# Patient Record
Sex: Male | Born: 2000 | Race: Black or African American | Hispanic: No | Marital: Single | Smoking: Never smoker
Health system: Southern US, Community
[De-identification: ages and names within clinical notes are randomized; demographics above are authoritative.]

## PROBLEM LIST (undated history)

## (undated) DIAGNOSIS — J45909 Unspecified asthma, uncomplicated: Secondary | ICD-10-CM

---
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
## 2022-01-12 IMAGING — DX DG HAND COMPLETE 3+V*R*
3 series · 3 of 3 positions shown · non-contrast
Comparison: None.

CLINICAL DATA: 19-year-old male with pain after punching an object.

EXAM:
RIGHT HAND - COMPLETE 3+ VIEW

[x hand pa right]
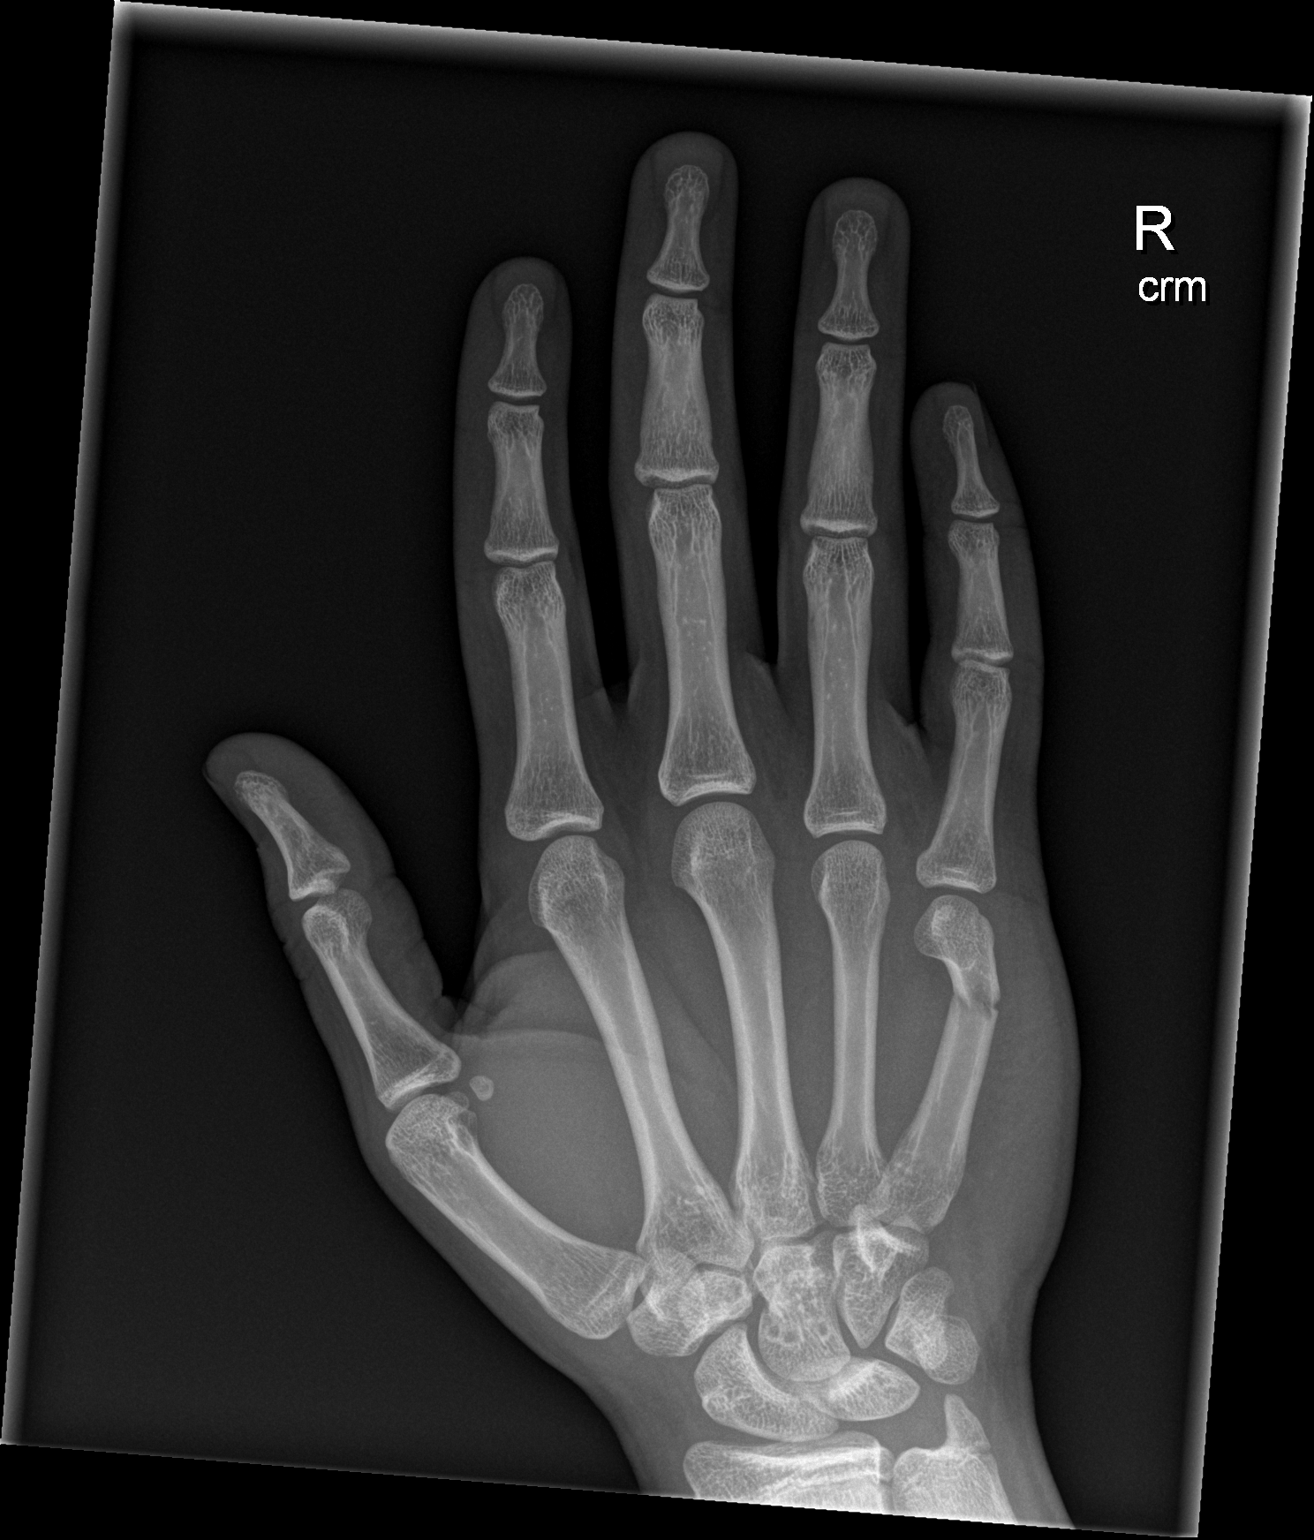

[x hand obl right]
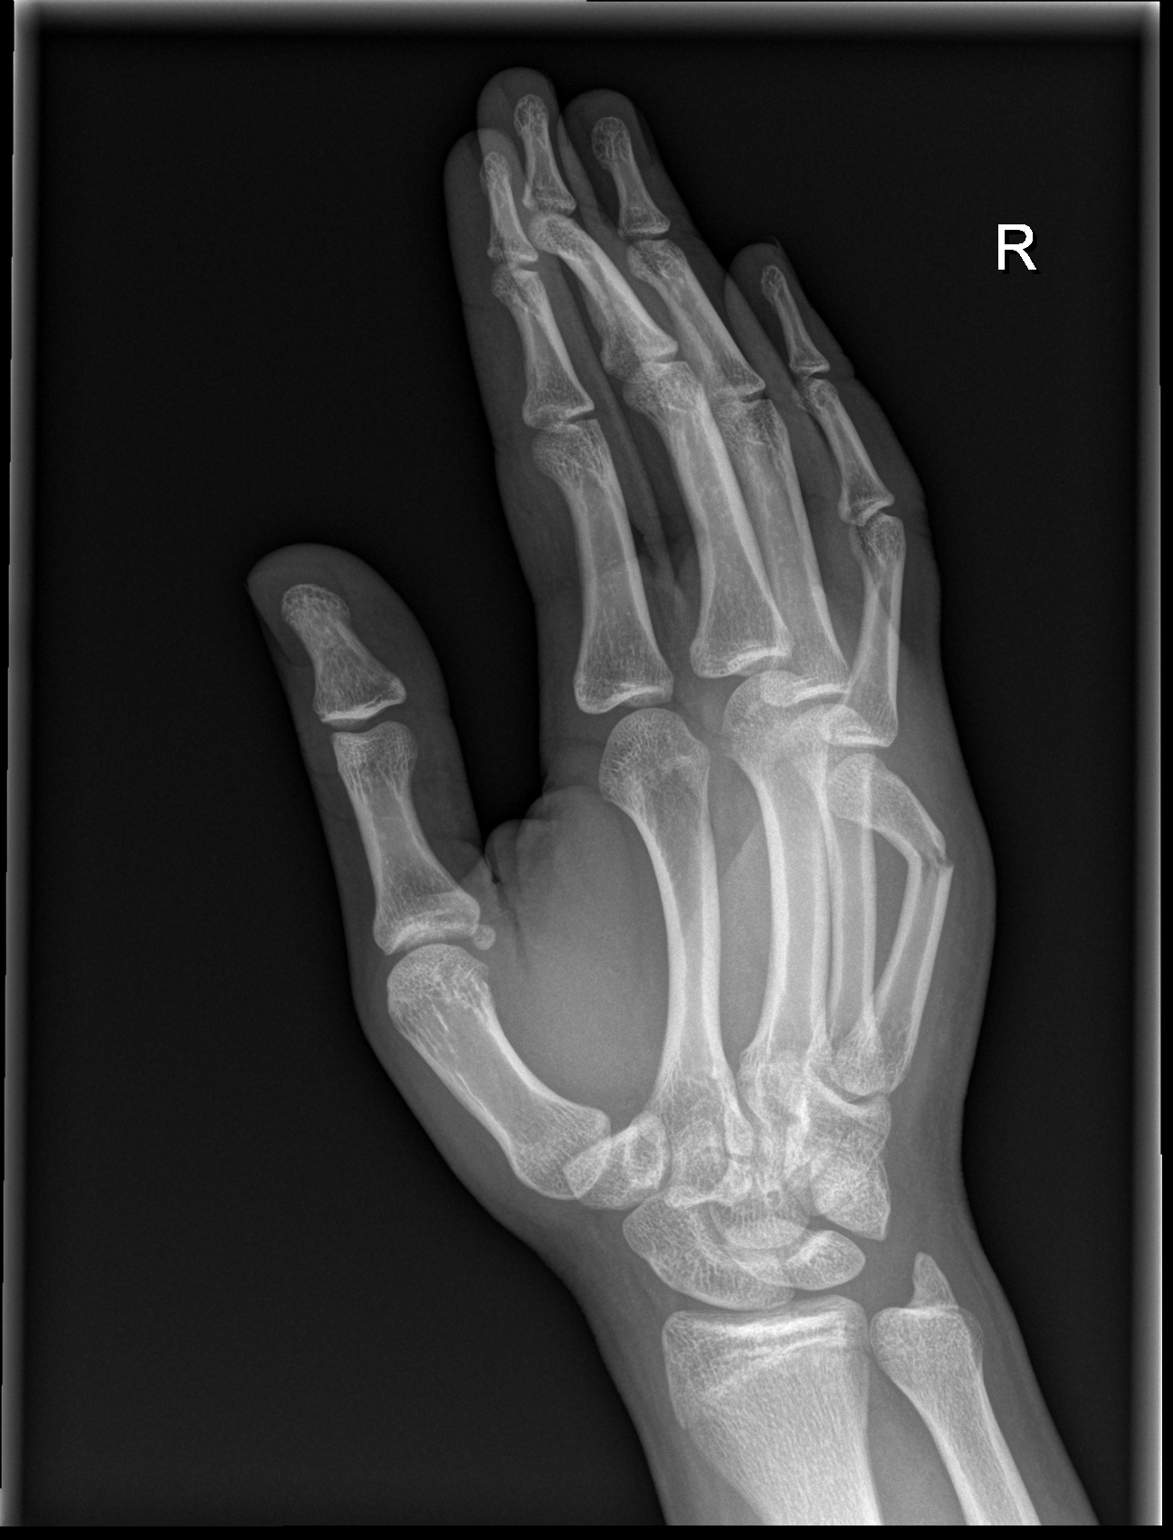

[x hand lat right]
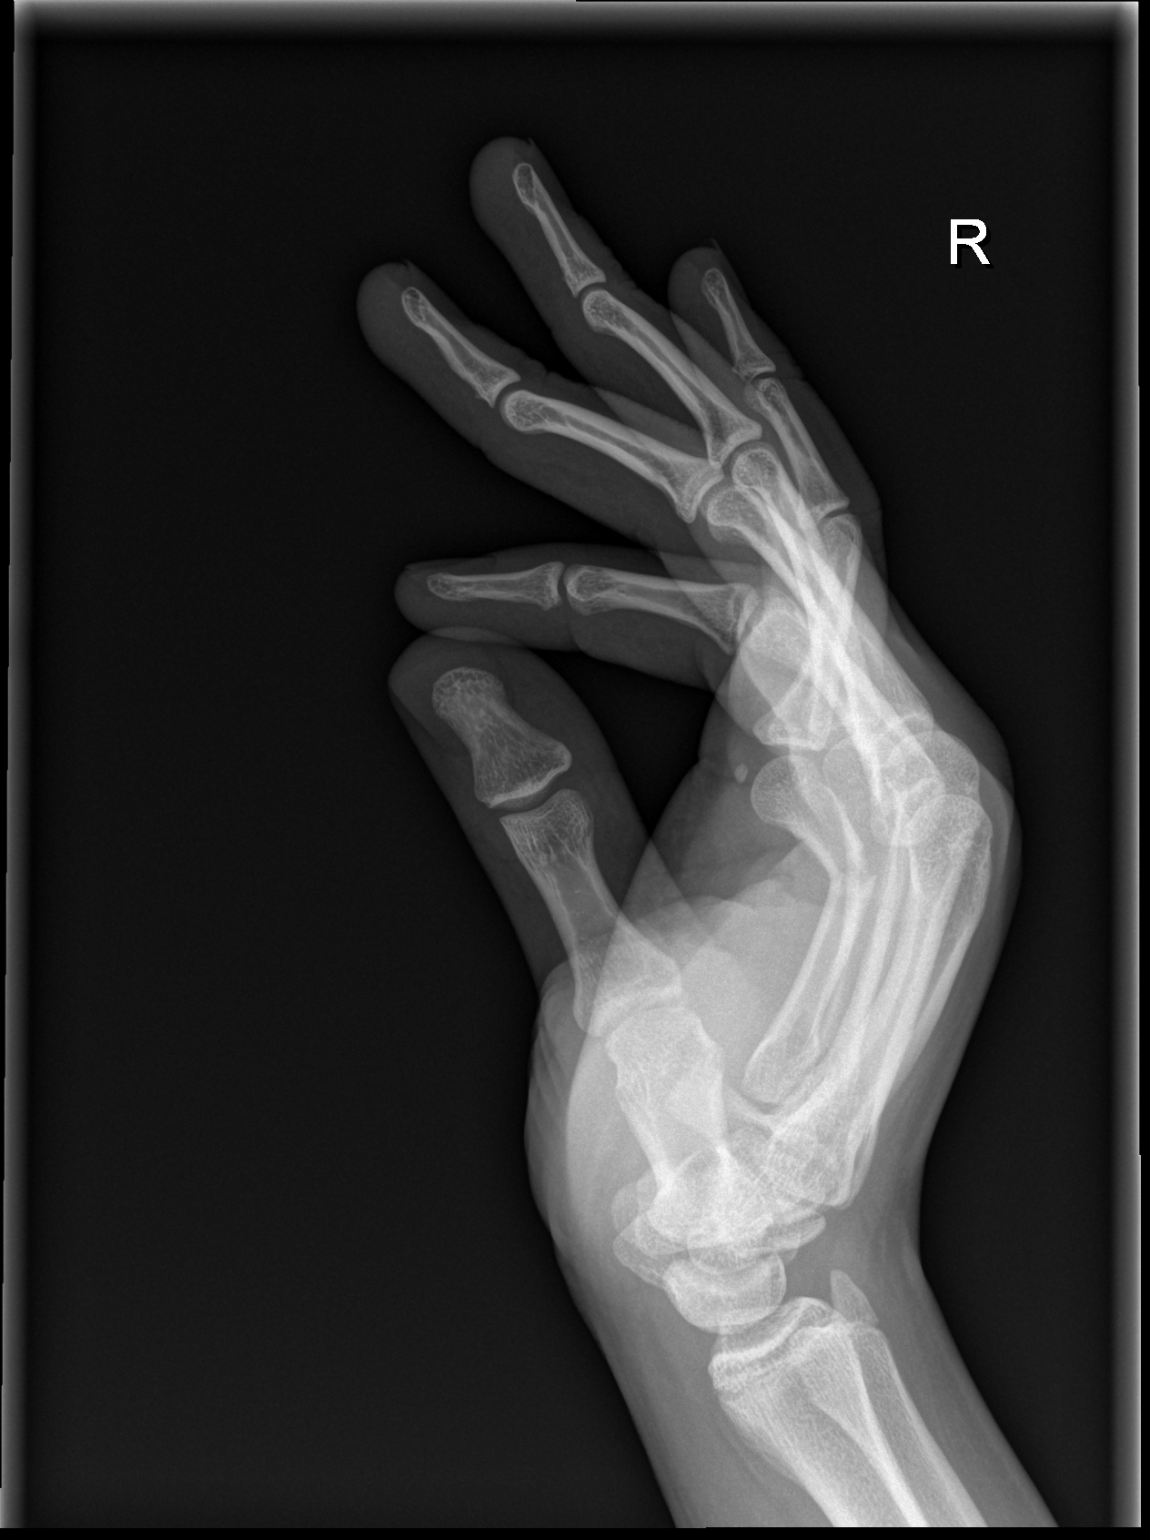

[3 of 3 positions shown; findings below may reference images not displayed]

FINDINGS: Transverse mildly comminuted fracture of the distal shaft right 5th
metacarpal with about 60 degrees of radial and volar angulation.
There is slight ulnar displacement. Regional soft tissue swelling.
Fifth MCP joint is spared. Nearing skeletal maturity. Normal
underlying bone mineralization. No other osseous abnormality
identified.
IMPRESSION: Transverse, angulated fracture of the distal shaft of the right 5th
metacarpal.

## 2022-02-05 ENCOUNTER — Encounter (HOSPITAL_COMMUNITY): Payer: Self-pay | Admitting: *Deleted

## 2022-02-05 ENCOUNTER — Ambulatory Visit (HOSPITAL_COMMUNITY)
Admission: EM | Admit: 2022-02-05 | Discharge: 2022-02-05 | Disposition: A | Discharge status: Home / Self Care | Payer: Medicaid Other

## 2022-02-05 ENCOUNTER — Other Ambulatory Visit: Payer: Self-pay

## 2022-02-05 DIAGNOSIS — J069 Acute upper respiratory infection, unspecified: Secondary | ICD-10-CM | POA: Diagnosis not present

## 2022-02-05 DIAGNOSIS — S46912A Strain of unspecified muscle, fascia and tendon at shoulder and upper arm level, left arm, initial encounter: Secondary | ICD-10-CM | POA: Diagnosis not present

## 2022-02-05 NOTE — ED Provider Notes (Signed)
[facility]    CSN: [id number] Arrival date & time: 02/05/22  0810      History   Chief Complaint Chief Complaint  Patient presents with   Muscle Pain   Cough   Nasal Congestion    HPI Dean Lane is a 21 y.o. male.   He has 2 complaints. Last week on 02/02/22, he noticed his L anterior shoulder/upper chest was sore. It really only bothers him when he tries to lie on his L side and occasionally at work in [employer] where he works when he is lifting a lot of things. Has done nothing to help it.   Also c/o bad taste in his mouth for the last 5 days. ALso has little appetite, congestion, post nasal drainage, cough, and mild sore throat. Denies fever, chills, myalgias. HAs not taken anything for his symptoms. They aren't bothersome enough to keep him from his usual activities.    Muscle Pain  Cough   Past Medical History:  Diagnosis Date   Asthma     Patient Active Problem List   Diagnosis Date Noted   Status asthmaticus 01/16/2012   Acute respiratory failure (HCC) 01/16/2012   Community acquired pneumonia 01/14/2012   Hypoxemia 01/14/2012   Asthma exacerbation 01/14/2012    History reviewed. No pertinent surgical history.     Home Medications    Prior to Admission medications   Medication Sig Start Date End Date Taking? Authorizing Provider  albuterol (PROVENTIL HFA;VENTOLIN HFA) 108 (90 BASE) MCG/ACT inhaler Inhale 2 puffs into the lungs every 4 (four) hours as needed. For wheezing and shortness of breath    [provider]    Family History Family History  Problem Relation Age of Onset   Asthma Sister    Asthma Brother    Arthritis Maternal Grandmother    Hypertension Maternal Grandmother    Birth defects Maternal Grandfather    Hyperlipidemia Paternal Grandmother    Depression Paternal Grandfather    Hyperlipidemia Paternal Grandfather     Social History Social History   Tobacco Use   Smoking status: Never      Allergies   Patient has no known allergies.   Review of Systems Review of Systems  Respiratory:  Positive for cough.      Physical Exam Triage Vital Signs ED Triage Vitals  Enc Vitals Group     BP 02/05/22 0855 112/74     Pulse Rate 02/05/22 0855 69     Resp 02/05/22 0855 18     Temp 02/05/22 0855 98.3 F (36.8 C)     Temp src --      SpO2 02/05/22 0855 98 %     Weight --      Height --      Head Circumference --      Peak Flow --      Pain Score 02/05/22 0853 4     Pain Loc --      Pain Edu? --      Excl. in GC? --    No data found.  Updated Vital Signs BP 112/74   Pulse 69   Temp 98.3 F (36.8 C)   Resp 18   SpO2 98%   Visual Acuity Right Eye Distance:   Left Eye Distance:   Bilateral Distance:    Right Eye Near:   Left Eye Near:    Bilateral Near:     Physical Exam Constitutional:      Appearance: Normal appearance.  HENT:  Right Ear: Tympanic membrane, ear canal and external ear normal.     Left Ear: Tympanic membrane, ear canal and external ear normal.     Nose: Nose normal.     Mouth/Throat:     Mouth: Mucous membranes are moist.     Dentition: Normal dentition. No dental caries or gum lesions.     Pharynx: Oropharynx is clear. No oropharyngeal exudate or posterior oropharyngeal erythema.     Tonsils: No tonsillar exudate. 1+ on the right. 1+ on the left.  Cardiovascular:     Rate and Rhythm: Normal rate and regular rhythm.  Pulmonary:     Effort: Pulmonary effort is normal.     Breath sounds: Normal breath sounds.  Musculoskeletal:     Left shoulder: Normal. No deformity, tenderness or bony tenderness. Normal range of motion. Normal strength.       Arms:  Neurological:     Mental Status: He is alert.      UC Treatments / Results  Labs (all labs ordered are listed, but only abnormal results are displayed) Labs Reviewed - No data to display  EKG   Radiology No results found.  Procedures Procedures (including  critical care time)  Medications Ordered in UC Medications - No data to display  Initial Impression / Assessment and Plan / UC Course  I have reviewed the triage vital signs and the nursing notes.  Pertinent labs & imaging results that were available during my care of the patient were reviewed by me and considered in my medical decision making (see chart for details).     REviewed supportive care measures for both likely URI and for shoulder discomfort.  Final Clinical Impressions(s) / UC Diagnoses   Final diagnoses:  Viral upper respiratory tract infection  Shoulder strain, left, initial encounter     Discharge Instructions      Try Mucinex (or generic guaifenesin) to help with congestion and bad taste in your mouth. Saline nasal spray (it's just salt water sold in a nose spray bottle in pharmacies) several times a day can also help.   You can use ibuprofen for your shoulder pain if needed. Just follow package instructions. Stretching gently can also help.    ED Prescriptions   None    PDMP not reviewed this encounter.   Cathlyn Parsons, NP 02/05/22 1110

## 2022-02-05 NOTE — ED Triage Notes (Signed)
Pt reports Last wed he started to have muscle pain lt shoulder pain. Pt reports he could not sleep on lt side due to pain. Pt also has pain when taking a deep breath. Pt has had in creased mucous production and a change in tasted when drinking liquids. Pt reports sore throat for one day.

## 2022-02-05 NOTE — Discharge Instructions (Addendum)
Try Mucinex (or generic guaifenesin) to help with congestion and bad taste in your mouth. Saline nasal spray (it's just salt water sold in a nose spray bottle in pharmacies) several times a day can also help.   You can use ibuprofen for your shoulder pain if needed. Just follow package instructions. Stretching gently can also help.

## 2022-06-18 ENCOUNTER — Encounter (HOSPITAL_COMMUNITY): Payer: Self-pay

## 2022-06-18 ENCOUNTER — Emergency Department (HOSPITAL_COMMUNITY): Payer: No Typology Code available for payment source

## 2022-06-18 ENCOUNTER — Other Ambulatory Visit: Payer: Self-pay

## 2022-06-18 ENCOUNTER — Emergency Department (HOSPITAL_COMMUNITY)
Admission: EM | Admit: 2022-06-18 | Discharge: 2022-06-18 | Disposition: A | Discharge status: Home / Self Care | Payer: No Typology Code available for payment source | Attending: Emergency Medicine | Admitting: Emergency Medicine

## 2022-06-18 DIAGNOSIS — M25512 Pain in left shoulder: Secondary | ICD-10-CM | POA: Diagnosis not present

## 2022-06-18 DIAGNOSIS — M25561 Pain in right knee: Secondary | ICD-10-CM | POA: Insufficient documentation

## 2022-06-18 DIAGNOSIS — S61411A Laceration without foreign body of right hand, initial encounter: Secondary | ICD-10-CM | POA: Insufficient documentation

## 2022-06-18 DIAGNOSIS — S6991XA Unspecified injury of right wrist, hand and finger(s), initial encounter: Secondary | ICD-10-CM | POA: Diagnosis present

## 2022-06-18 DIAGNOSIS — Y92410 Unspecified street and highway as the place of occurrence of the external cause: Secondary | ICD-10-CM | POA: Insufficient documentation

## 2022-06-18 MED ORDER — IBUPROFEN 400 MG PO TABS
600.0000 mg | ORAL_TABLET | Freq: Once | ORAL | Status: AC
  Administered 2022-06-18: 600 mg via ORAL
  Filled 2022-06-18: qty 1
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
